# Patient Record
Sex: Female | Born: 1985 | Race: White | Hispanic: No | Marital: Single | Smoking: Current every day smoker
Health system: Southern US, Community
[De-identification: ages and names within clinical notes are randomized; demographics above are authoritative.]

## PROBLEM LIST (undated history)

## (undated) ENCOUNTER — Inpatient Hospital Stay (HOSPITAL_COMMUNITY): Payer: Self-pay

## (undated) DIAGNOSIS — M47816 Spondylosis without myelopathy or radiculopathy, lumbar region: Secondary | ICD-10-CM

## (undated) HISTORY — PX: TENDON REPAIR: SHX5111

## (undated) HISTORY — PX: BREAST SURGERY: SHX581

---
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
## 2015-03-13 ENCOUNTER — Inpatient Hospital Stay (HOSPITAL_COMMUNITY)
Admission: AD | Admit: 2015-03-13 | Discharge: 2015-03-13 | Disposition: A | Discharge status: Home / Self Care | Payer: Medicaid Other | Source: Ambulatory Visit | Attending: Obstetrics and Gynecology | Admitting: Obstetrics and Gynecology

## 2015-03-13 ENCOUNTER — Inpatient Hospital Stay (HOSPITAL_COMMUNITY): Payer: Medicaid Other

## 2015-03-13 ENCOUNTER — Encounter (HOSPITAL_COMMUNITY): Payer: Self-pay | Admitting: *Deleted

## 2015-03-13 DIAGNOSIS — O26899 Other specified pregnancy related conditions, unspecified trimester: Secondary | ICD-10-CM

## 2015-03-13 DIAGNOSIS — O9989 Other specified diseases and conditions complicating pregnancy, childbirth and the puerperium: Secondary | ICD-10-CM | POA: Diagnosis not present

## 2015-03-13 DIAGNOSIS — R109 Unspecified abdominal pain: Secondary | ICD-10-CM | POA: Diagnosis not present

## 2015-03-13 DIAGNOSIS — O99331 Smoking (tobacco) complicating pregnancy, first trimester: Secondary | ICD-10-CM | POA: Diagnosis not present

## 2015-03-13 DIAGNOSIS — Z3A11 11 weeks gestation of pregnancy: Secondary | ICD-10-CM | POA: Diagnosis not present

## 2015-03-13 DIAGNOSIS — F17210 Nicotine dependence, cigarettes, uncomplicated: Secondary | ICD-10-CM | POA: Insufficient documentation

## 2015-03-13 HISTORY — DX: Spondylosis without myelopathy or radiculopathy, lumbar region: M47.816

## 2015-03-13 LAB — CBC
HCT: 35.8 % — ABNORMAL LOW (ref 36.0–46.0)
Hemoglobin: 12.8 g/dL (ref 12.0–15.0)
MCH: 33.7 pg (ref 26.0–34.0)
MCHC: 35.8 g/dL (ref 30.0–36.0)
MCV: 94.2 fL (ref 78.0–100.0)
Platelets: 187 10*3/uL (ref 150–400)
RBC: 3.8 MIL/uL — ABNORMAL LOW (ref 3.87–5.11)
RDW: 13.5 % (ref 11.5–15.5)
WBC: 7.7 10*3/uL (ref 4.0–10.5)

## 2015-03-13 LAB — URINALYSIS, ROUTINE W REFLEX MICROSCOPIC
Bilirubin Urine: NEGATIVE
Glucose, UA: NEGATIVE mg/dL
Hgb urine dipstick: NEGATIVE
KETONES UR: NEGATIVE mg/dL
Leukocytes, UA: NEGATIVE
NITRITE: NEGATIVE
PH: 6 (ref 5.0–8.0)
PROTEIN: NEGATIVE mg/dL
Specific Gravity, Urine: 1.025 (ref 1.005–1.030)
UROBILINOGEN UA: 0.2 mg/dL (ref 0.0–1.0)

## 2015-03-13 LAB — HCG, QUANTITATIVE, PREGNANCY: hCG, Beta Chain, Quant, S: 104940 m[IU]/mL — ABNORMAL HIGH (ref <5)

## 2015-03-13 LAB — ABO/RH: ABO/RH(D): O POS

## 2015-03-13 NOTE — Progress Notes (Signed)
Has first appointment with K. Senaida Oresichardson on 03/19/15

## 2015-03-13 NOTE — MAU Note (Signed)
Pt stated she over streched this morning and ic/o some cramping and pain in her pelvic area.

## 2015-03-13 NOTE — Discharge Instructions (Signed)
Abdominal Pain During Pregnancy °Abdominal pain is common in pregnancy. Most of the time, it does not cause harm. There are many causes of abdominal pain. Some causes are more serious than others. Some of the causes of abdominal pain in pregnancy are easily diagnosed. Occasionally, the diagnosis takes time to understand. Other times, the cause is not determined. Abdominal pain can be a sign that something is very wrong with the pregnancy, or the pain may have nothing to do with the pregnancy at all. For this reason, always tell your health care provider if you have any abdominal discomfort. °HOME CARE INSTRUCTIONS  °Monitor your abdominal pain for any changes. The following actions may help to alleviate any discomfort you are experiencing: °· Do not have sexual intercourse or put anything in your vagina until your symptoms go away completely. °· Get plenty of rest until your pain improves. °· Drink clear fluids if you feel nauseous. Avoid solid food as long as you are uncomfortable or nauseous. °· Only take over-the-counter or prescription medicine as directed by your health care provider. °· Keep all follow-up appointments with your health care provider. °SEEK IMMEDIATE MEDICAL CARE IF: °· You are bleeding, leaking fluid, or passing tissue from the vagina. °· You have increasing pain or cramping. °· You have persistent vomiting. °· You have painful or bloody urination. °· You have a fever. °· You notice a decrease in your baby's movements. °· You have extreme weakness or feel faint. °· You have shortness of breath, with or without abdominal pain. °· You develop a severe headache with abdominal pain. °· You have abnormal vaginal discharge with abdominal pain. °· You have persistent diarrhea. °· You have abdominal pain that continues even after rest, or gets worse. °MAKE SURE YOU:  °· Understand these instructions. °· Will watch your condition. °· Will get help right away if you are not doing well or get  worse. °Document Released: 11/01/2005 Document Revised: 08/22/2013 Document Reviewed: 05/31/2013 °ExitCare® Patient Information ©2015 [employer]. This information is not intended to replace advice given to you by your health care provider. Make sure you discuss any questions you have with your health care provider. °First Trimester of Pregnancy °The first trimester of pregnancy is from week 1 until the end of week 12 (months 1 through 3). A week after a sperm fertilizes an egg, the egg will implant on the wall of the uterus. This embryo will begin to develop into a baby. Genes from you and your partner are forming the baby. The female genes determine whether the baby is a boy or a girl. At 6-8 weeks, the eyes and face are formed, and the heartbeat can be seen on ultrasound. At the end of 12 weeks, all the baby's organs are formed.  °Now that you are pregnant, you will want to do everything you can to have a healthy baby. Two of the most important things are to get good prenatal care and to follow your health care provider's instructions. Prenatal care is all the medical care you receive before the baby's birth. This care will help prevent, find, and treat any problems during the pregnancy and childbirth. °BODY CHANGES °Your body goes through many changes during pregnancy. The changes vary from woman to woman.  °· You may gain or lose a couple of pounds at first. °· You may feel sick to your stomach (nauseous) and throw up (vomit). If the vomiting is uncontrollable, call your health care provider. °· You may tire easily. °·   You may develop headaches that can be relieved by medicines approved by your health care provider. °· You may urinate more often. Painful urination may mean you have a bladder infection. °· You may develop heartburn as a result of your pregnancy. °· You may develop constipation because certain hormones are causing the muscles that push waste through your intestines to slow down. °· You may  develop hemorrhoids or swollen, bulging veins (varicose veins). °· Your breasts may begin to grow larger and become tender. Your nipples may stick out more, and the tissue that surrounds them (areola) may become darker. °· Your gums may bleed and may be sensitive to brushing and flossing. °· Dark spots or blotches (chloasma, mask of pregnancy) may develop on your face. This will likely fade after the baby is born. °· Your menstrual periods will stop. °· You may have a loss of appetite. °· You may develop cravings for certain kinds of food. °· You may have changes in your emotions from day to day, such as being excited to be pregnant or being concerned that something may go wrong with the pregnancy and baby. °· You may have more vivid and strange dreams. °· You may have changes in your hair. These can include thickening of your hair, rapid growth, and changes in texture. Some women also have hair loss during or after pregnancy, or hair that feels dry or thin. Your hair will most likely return to normal after your baby is born. °WHAT TO EXPECT AT YOUR PRENATAL VISITS °During a routine prenatal visit: °· You will be weighed to make sure you and the baby are growing normally. °· Your blood pressure will be taken. °· Your abdomen will be measured to track your baby's growth. °· The fetal heartbeat will be listened to starting around week 10 or 12 of your pregnancy. °· Test results from any previous visits will be discussed. °Your health care provider may ask you: °· How you are feeling. °· If you are feeling the baby move. °· If you have had any abnormal symptoms, such as leaking fluid, bleeding, severe headaches, or abdominal cramping. °· If you have any questions. °Other tests that may be performed during your first trimester include: °· Blood tests to find your blood type and to check for the presence of any previous infections. They will also be used to check for low iron levels (anemia) and Rh antibodies. Later in  the pregnancy, blood tests for diabetes will be done along with other tests if problems develop. °· Urine tests to check for infections, diabetes, or protein in the urine. °· An ultrasound to confirm the proper growth and development of the baby. °· An amniocentesis to check for possible genetic problems. °· Fetal screens for spina bifida and Down syndrome. °· You may need other tests to make sure you and the baby are doing well. °HOME CARE INSTRUCTIONS  °Medicines °· Follow your health care provider's instructions regarding medicine use. Specific medicines may be either safe or unsafe to take during pregnancy. °· Take your prenatal vitamins as directed. °· If you develop constipation, try taking a stool softener if your health care provider approves. °Diet °· Eat regular, well-balanced meals. Choose a variety of foods, such as meat or vegetable-based protein, fish, milk and low-fat dairy products, vegetables, fruits, and whole grain breads and cereals. Your health care provider will help you determine the amount of weight gain that is right for you. °· Avoid raw meat and uncooked cheese. These   carry germs that can cause birth defects in the baby. °· Eating four or five small meals rather than three large meals a day may help relieve nausea and vomiting. If you start to feel nauseous, eating a few soda crackers can be helpful. Drinking liquids between meals instead of during meals also seems to help nausea and vomiting. °· If you develop constipation, eat more high-fiber foods, such as fresh vegetables or fruit and whole grains. Drink enough fluids to keep your urine clear or pale yellow. °Activity and Exercise °· Exercise only as directed by your health care provider. Exercising will help you: °¨ Control your weight. °¨ Stay in shape. °¨ Be prepared for labor and delivery. °· Experiencing pain or cramping in the lower abdomen or low back is a good sign that you should stop exercising. Check with your health care  provider before continuing normal exercises. °· Try to avoid standing for long periods of time. Move your legs often if you must stand in one place for a long time. °· Avoid heavy lifting. °· Wear low-heeled shoes, and practice good posture. °· You may continue to have sex unless your health care provider directs you otherwise. °Relief of Pain or Discomfort °· Wear a good support bra for breast tenderness.   °· Take warm sitz baths to soothe any pain or discomfort caused by hemorrhoids. Use hemorrhoid cream if your health care provider approves.   °· Rest with your legs elevated if you have leg cramps or low back pain. °· If you develop varicose veins in your legs, wear support hose. Elevate your feet for 15 minutes, 3-4 times a day. Limit salt in your diet. °Prenatal Care °· Schedule your prenatal visits by the twelfth week of pregnancy. They are usually scheduled monthly at first, then more often in the last 2 months before delivery. °· Write down your questions. Take them to your prenatal visits. °· Keep all your prenatal visits as directed by your health care provider. °Safety °· Wear your seat belt at all times when driving. °· Make a list of emergency phone numbers, including numbers for family, friends, the hospital, and police and fire departments. °General Tips °· Ask your health care provider for a referral to a local prenatal education class. Begin classes no later than at the beginning of month 6 of your pregnancy. °· Ask for help if you have counseling or nutritional needs during pregnancy. Your health care provider can offer advice or refer you to specialists for help with various needs. °· Do not use hot tubs, steam rooms, or saunas. °· Do not douche or use tampons or scented sanitary pads. °· Do not cross your legs for long periods of time. °· Avoid cat litter boxes and soil used by cats. These carry germs that can cause birth defects in the baby and possibly loss of the fetus by miscarriage or  stillbirth. °· Avoid all smoking, herbs, alcohol, and medicines not prescribed by your health care provider. Chemicals in these affect the formation and growth of the baby. °· Schedule a dentist appointment. At home, brush your teeth with a soft toothbrush and be gentle when you floss. °SEEK MEDICAL CARE IF:  °· You have dizziness. °· You have mild pelvic cramps, pelvic pressure, or nagging pain in the abdominal area. °· You have persistent nausea, vomiting, or diarrhea. °· You have a bad smelling vaginal discharge. °· You have pain with urination. °· You notice increased swelling in your face, hands, legs, or ankles. °SEEK   IMMEDIATE MEDICAL CARE IF:  °· You have a fever. °· You are leaking fluid from your vagina. °· You have spotting or bleeding from your vagina. °· You have severe abdominal cramping or pain. °· You have rapid weight gain or loss. °· You vomit blood or material that looks like coffee grounds. °· You are exposed to German measles and have never had them. °· You are exposed to fifth disease or chickenpox. °· You develop a severe headache. °· You have shortness of breath. °· You have any kind of trauma, such as from a fall or a car accident. °Document Released: 10/26/2001 Document Revised: 03/18/2014 Document Reviewed: 09/11/2013 °ExitCare® Patient Information ©2015 [employer]. This information is not intended to replace advice given to you by your health care provider. Make sure you discuss any questions you have with your health care provider. ° °

## 2015-03-13 NOTE — MAU Provider Note (Signed)
History     CSN: [id number]  Arrival date and time: 03/13/15 1123   None     Chief Complaint  Patient presents with  . Abdominal Pain   Abdominal Pain This is a new problem. The current episode started today. The onset quality is sudden. The problem occurs constantly. The most recent episode lasted 4 hours. The pain is located in the generalized abdominal region. The pain is at a severity of 4/10. The pain is mild. The quality of the pain is cramping. The abdominal pain does not radiate. Pertinent negatives include no dysuria. The pain is aggravated by certain positions. She has tried nothing for the symptoms.   29 y.o. [id number] @ 910w1d presents to the MAU with the complaint of cramping some after stretching this morning. Denies vaginal bleeding, vaginal discharge.   Past Medical History  Diagnosis Date  . Spondylosis of lumbar joint 2006    Past Surgical History  Procedure Laterality Date  . Breast surgery      removal of non cancerous cyst on right breast  . Tendon repair      right index finger    History reviewed. No pertinent family history.  History  Substance Use Topics  . Smoking status: Current Every Day Smoker -- 0.25 packs/day    Types: Cigarettes  . Smokeless tobacco: Not on file  . Alcohol Use: No    Allergies: No Known Allergies  Prescriptions prior to admission  Medication Sig Dispense Refill Last Dose  . Prenatal Vit-Min-FA-Fish Oil ([facility] PRENATAL GUMMY PO) Take 2 each by mouth daily.   03/12/2015 at Unknown time    Review of Systems  Gastrointestinal: Positive for abdominal pain.  Genitourinary: Negative for dysuria.  Musculoskeletal: Positive for back pain.  All other systems reviewed and are negative.  Physical Exam   Blood pressure 110/63, pulse 64, temperature 98.4 F (36.9 C), resp. rate 18, height 5\' 4"  (1.626 m).  Physical Exam  Nursing note and vitals reviewed. Constitutional: She is oriented to person, place, and time. She appears  well-developed and well-nourished. No distress.  HENT:  Head: Normocephalic and atraumatic.  Neck: Normal range of motion.  Cardiovascular: Normal rate.   Respiratory: Effort normal. No respiratory distress.  GI: Soft. She exhibits no distension and no mass. There is no tenderness. There is no rebound and no guarding.  Musculoskeletal: Normal range of motion.  Neurological: She is alert and oriented to person, place, and time.  Skin: Skin is warm and dry.  Psychiatric: She has a normal mood and affect. Her behavior is normal. Judgment and thought content normal.   Results for orders placed or performed during the hospital encounter of 03/13/15 (from the past 24 hour(s))  Urinalysis, Routine w reflex microscopic     Status: None   Collection Time: 03/13/15 11:40 AM  Result Value Ref Range   Color, Urine YELLOW YELLOW   APPearance CLEAR CLEAR   Specific Gravity, Urine 1.025 1.005 - 1.030   pH 6.0 5.0 - 8.0   Glucose, UA NEGATIVE NEGATIVE mg/dL   Hgb urine dipstick NEGATIVE NEGATIVE   Bilirubin Urine NEGATIVE NEGATIVE   Ketones, ur NEGATIVE NEGATIVE mg/dL   Protein, ur NEGATIVE NEGATIVE mg/dL   Urobilinogen, UA 0.2 0.0 - 1.0 mg/dL   Nitrite NEGATIVE NEGATIVE   Leukocytes, UA NEGATIVE NEGATIVE  CBC     Status: Abnormal   Collection Time: 03/13/15 12:20 PM  Result Value Ref Range   WBC 7.7 4.0 - 10.5 K/uL  RBC 3.80 (L) 3.87 - 5.11 MIL/uL   Hemoglobin 12.8 12.0 - 15.0 g/dL   HCT 81.1 (L) 91.4 - 78.2 %   MCV 94.2 78.0 - 100.0 fL   MCH 33.7 26.0 - 34.0 pg   MCHC 35.8 30.0 - 36.0 g/dL   RDW 95.6 21.3 - 08.6 %   Platelets 187 150 - 400 K/uL  ABO/Rh     Status: None (Preliminary result)   Collection Time: 03/13/15 12:20 PM  Result Value Ref Range   ABO/RH(D) O POS     MAU Course  Procedures US Ob Comp Less 14 Wks  03/13/2015   CLINICAL DATA:  Cramping and pain.  EXAM: OBSTETRIC <14 WK ULTRASOUND  TECHNIQUE: Transabdominal ultrasound was performed for evaluation of the gestation  as well as the maternal uterus and adnexal regions.  COMPARISON:  None.  FINDINGS: Intrauterine gestational sac: Single  Yolk sac:  Yes  Embryo:  Yes  Cardiac Activity: Yes  Heart Rate: 162 bpm  CRL:   43.1  mm   11 w 1 d                  [place] EDC: 10/01/2015  Maternal uterus/adnexae:  Subchorionic hemorrhage: No  Right ovary: Not visualize  Left ovary: Appears normal  Other :None  Free fluid:  None  IMPRESSION: 1. There is a single living intrauterine gestation. The estimated gestational age is 11 weeks and 1 day. 2. No complications.   Electronically Signed   By: Signa Kell M.D.   On: 03/13/2015 13:03    MDM Pt has not initiated prenatal care but has her first prenatal visit scheduled with Dr Senaida Ores next week. CBC, U/S; beta hcg; abo   Assessment and Plan  Abdominal Pain in pregnancy  Discharge to home  [facility] 03/13/2015, 1:11 PM

## 2015-03-28 LAB — US OB FOLLOW UP

## 2015-03-31 ENCOUNTER — Other Ambulatory Visit (HOSPITAL_COMMUNITY): Payer: Self-pay | Admitting: Obstetrics and Gynecology

## 2015-03-31 DIAGNOSIS — O283 Abnormal ultrasonic finding on antenatal screening of mother: Secondary | ICD-10-CM

## 2015-04-01 ENCOUNTER — Encounter (HOSPITAL_COMMUNITY): Payer: Self-pay | Admitting: Physician Assistant

## 2015-04-01 ENCOUNTER — Encounter (HOSPITAL_COMMUNITY): Payer: Self-pay | Admitting: Obstetrics and Gynecology

## 2015-04-08 ENCOUNTER — Ambulatory Visit (HOSPITAL_COMMUNITY): Payer: Medicaid Other

## 2015-04-08 ENCOUNTER — Ambulatory Visit (HOSPITAL_COMMUNITY): Admission: RE | Admit: 2015-04-08 | Payer: Medicaid Other | Source: Ambulatory Visit

## 2015-04-11 ENCOUNTER — Ambulatory Visit (HOSPITAL_COMMUNITY): Payer: Medicaid Other

## 2015-04-15 ENCOUNTER — Encounter (HOSPITAL_COMMUNITY): Payer: Self-pay

## 2015-04-15 ENCOUNTER — Other Ambulatory Visit (HOSPITAL_COMMUNITY): Payer: Self-pay | Admitting: Maternal and Fetal Medicine

## 2015-04-15 ENCOUNTER — Ambulatory Visit (HOSPITAL_COMMUNITY)
Admission: RE | Admit: 2015-04-15 | Discharge: 2015-04-15 | Disposition: A | Discharge status: Home / Self Care | Payer: Medicaid Other | Source: Ambulatory Visit | Attending: Obstetrics and Gynecology | Admitting: Obstetrics and Gynecology

## 2015-04-15 DIAGNOSIS — O283 Abnormal ultrasonic finding on antenatal screening of mother: Secondary | ICD-10-CM | POA: Insufficient documentation

## 2015-04-15 DIAGNOSIS — Z3689 Encounter for other specified antenatal screening: Secondary | ICD-10-CM | POA: Insufficient documentation

## 2015-04-15 DIAGNOSIS — O358XX0 Maternal care for other (suspected) fetal abnormality and damage, not applicable or unspecified: Secondary | ICD-10-CM | POA: Insufficient documentation

## 2015-04-15 DIAGNOSIS — Z3A15 15 weeks gestation of pregnancy: Secondary | ICD-10-CM | POA: Insufficient documentation

## 2015-04-15 DIAGNOSIS — O35DXX0 Maternal care for other (suspected) fetal abnormality and damage, fetal gastrointestinal anomalies, not applicable or unspecified: Secondary | ICD-10-CM | POA: Insufficient documentation

## 2015-04-15 DIAGNOSIS — Z315 Encounter for genetic counseling: Secondary | ICD-10-CM | POA: Insufficient documentation

## 2015-04-15 NOTE — Progress Notes (Signed)
Genetic Counseling  High-Risk Gestation Note  Appointment Date:  04/15/2015 Referred By: Newton Pigg, MD Date of Birth:  [birth year]   Pregnancy History: [id number] Estimated Date of Delivery: 10/01/15 Estimated Gestational Age: 45w6dAttending: MRenella Cunas MD   I met with Gabriela Ross. Gabriela Ross TURVEYfor genetic counseling because of abnormal ultrasound findings.  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 Ultrasound performed today confirmed the finding of gastroschisis, which was previously visualized at her OB office. Complete ultrasound results reported separately.   We discussed that gastroschisis occurs in approximately 1 in 560,000-10,000live births, more often in babies born to younger mothers. We reviewed that gastroschisis is considered an abdominal wall defect which is characterized by an opening to the right of the umbilicus through which the intestines and other visceral organs protrude.  This opening usually arises from incomplete closure of the lateral folds during the sixth week of gestation.  We discussed that as a consequence of the intestinal herniation, the unprotected bowel may be damaged and not function well after delivery.  We discussed that gastroschisis is typically not associated with chromosomal anomalies or other structural malformations with the exception of intestinal atresia.  The majority of cases of gastroschisis are thought to be sporadic and multifactorial in etiology, with a low risk of recurrence.  However, there have been reports of both autosomal recessive and dominant inheritance.    We discussed that Ross to place the intestines into the baby's abdominal cavity is typically performed shortly after birth, to minimize the risk of infection.  In addition, we discussed  that prognosis is typically good, but depends on the size of the abdominal opening and whether or not damage has occurred to the exposed organs.    We also spent some time discussing the plans for the rest of the pregnancy.  Since gastroschisis exposes the fetal intestines to the amniotic fluid during pregnancy, there is an increased risk for third trimester complications, such as bowel dilatation, decreased fetal growth and amniotic fluid volume, preterm delivery, and stillbirth.  For these reasons, we discussed the option of close surveillance during the third trimester. Gabriela Ross. RDruryexpressed interest in possible transfer [facility] care [facility]  She understands [facility] delivery[facility]  We discussed the option of meeting with a pediatric surgeon, prior to delivery, to discuss postnatal management and surgical correction.  Gabriela Ross Ross expressed interest in meeting with a sPsychologist, sport and exercise  We will schedule this appointment for them.  We also discussed the option of [facility] with the [facility] and care provided there.  She was interested in this as well.  We will arrange those later in the pregnancy and coordinate those for this patient.  Gabriela Ross. Gabriela Ross Ross had noninvasive prenatal screening (NIPS)/cell free DNA testing performed through her OB office, [facility]. We reviewed that these are within normal limits, showing a less than 1 in 10,000 risk for trisomies 21, 18 and 13, and monosomy X (Turner syndrome).  In addition, the risk for triploidy/vanishing twin and sex chromosome trisomies (47,XXX and 47,XXY) was also low risk.  This testing identifies > 99% of pregnancies with trisomy 272 trisomy 170 sex chromosome trisomies (47,XXX and 47,XXY), and triploidy.  The detection rate for trisomy 18 is 96%.  The detection rate for monosomy X is ~92%.  The  false positive rate is <0.1% for all conditions. Testing was also consistent with female  fetal sex.  She understands that this testing does not identify all genetic conditions.   Both family histories were reviewed and found to be contributory for a possible birth defect for the father of the pregnancy's niece (his brother's daughter). The patient had limited information regarding this relative given limited contact with the brother of the father of the pregnancy. She was not sure of the specific type of birth defect but thought that surgical correction was possibly needed. She also reported that this relative does not likely have a genetic condition or syndrome according to the information other individuals provided to the patient about her. We discussed that birth defects can be sporadic, multifactorial, environmental, or genetic.  Without further information regarding the provided family history, an accurate genetic risk cannot be calculated. Further genetic counseling is warranted if more information is obtained.  Gabriela Ross Ross was provided with written information regarding cystic fibrosis (CF) including the carrier frequency and incidence in the Caucasian population, the availability of carrier testing and prenatal diagnosis if indicated.  In addition, we discussed that CF is routinely screened for as part of the [place] newborn screening panel.  She previously had CF carrier screening which was within normal limits.    Gabriela Ross. Gabriela Ross Ross denied exposure to environmental toxins or chemical agents. She denied the use of alcohol or street drugs. She reported smoking approximately 4-5 cigarettes per day. The associations of smoking in pregnancy were reviewed and cessation encouraged. She denied significant viral illnesses during the course of her pregnancy. Her medical and surgical histories were noncontributory.   I counseled Gabriela Ross. Gabriela Ross Ross regarding the above risks and available options.  The  approximate face-to-face time with the genetic counselor was 35 minutes.  Chipper Oman, Gabriela Ross Certified Genetic Counselor 04/15/2015

## 2015-04-16 ENCOUNTER — Other Ambulatory Visit (HOSPITAL_COMMUNITY): Payer: Self-pay | Admitting: Obstetrics and Gynecology

## 2015-04-16 ENCOUNTER — Encounter (HOSPITAL_COMMUNITY): Payer: Self-pay | Admitting: Obstetrics and Gynecology

## 2015-05-13 ENCOUNTER — Telehealth (HOSPITAL_COMMUNITY): Payer: Self-pay | Admitting: MS"

## 2015-05-13 ENCOUNTER — Ambulatory Visit (HOSPITAL_COMMUNITY)
Admission: RE | Admit: 2015-05-13 | Discharge: 2015-05-13 | Disposition: A | Discharge status: Home / Self Care | Payer: Medicaid Other | Source: Ambulatory Visit | Attending: Obstetrics and Gynecology | Admitting: Obstetrics and Gynecology

## 2015-05-13 DIAGNOSIS — O283 Abnormal ultrasonic finding on antenatal screening of mother: Secondary | ICD-10-CM

## 2015-05-13 NOTE — Telephone Encounter (Signed)
Attempted to call patient regarding missed appointment today. Patient's voicemail box is full, unable to leave message.   Gabriela Ross 05/13/2015 4:07 PM

## 2015-05-23 ENCOUNTER — Ambulatory Visit (HOSPITAL_COMMUNITY)
Admission: RE | Admit: 2015-05-23 | Discharge: 2015-05-23 | Disposition: A | Discharge status: Home / Self Care | Payer: Medicaid Other | Source: Ambulatory Visit | Attending: Obstetrics and Gynecology | Admitting: Obstetrics and Gynecology

## 2015-05-23 ENCOUNTER — Other Ambulatory Visit (HOSPITAL_COMMUNITY): Payer: Self-pay | Admitting: Obstetrics and Gynecology

## 2015-05-23 DIAGNOSIS — M7989 Other specified soft tissue disorders: Secondary | ICD-10-CM | POA: Insufficient documentation

## 2015-05-23 DIAGNOSIS — I82402 Acute embolism and thrombosis of unspecified deep veins of left lower extremity: Secondary | ICD-10-CM

## 2015-05-23 NOTE — Progress Notes (Signed)
VASCULAR LAB PRELIMINARY  PRELIMINARY  PRELIMINARY  PRELIMINARY  Left lower extremity venous duplex completed.    Preliminary report:  Left:  No evidence of DVT, superficial thrombosis, or Baker's cyst.  Reatha Sur, RVT 05/23/2015, 3:32 PM

## 2015-06-19 ENCOUNTER — Telehealth (HOSPITAL_COMMUNITY): Payer: Self-pay | Admitting: MS"

## 2015-06-19 NOTE — Telephone Encounter (Signed)
Called patient to follow-up about prenatal consultation with peds surgeon. Patient stated that she moved back to [place] and is planning to deliver there.   Gabriela Ross 06/19/2015 10:30 AM

## 2016-01-16 ENCOUNTER — Encounter (HOSPITAL_COMMUNITY): Payer: Self-pay | Admitting: *Deleted

## 2016-04-26 IMAGING — US US OB COMP LESS 14 WK
2 series · 14 of 28 positions shown · non-contrast
Comparison: None.

CLINICAL DATA: Cramping and pain.

EXAM:
OBSTETRIC <14 WK ULTRASOUND
TECHNIQUE: Transabdominal ultrasound was performed for evaluation of the
gestation as well as the maternal uterus and adnexal regions.

[Series 1: us ob comp less 14 wk · 0.17mm/px · 35 acquisitions, 13 frames shown (1 of 2)]
[im 2/35]
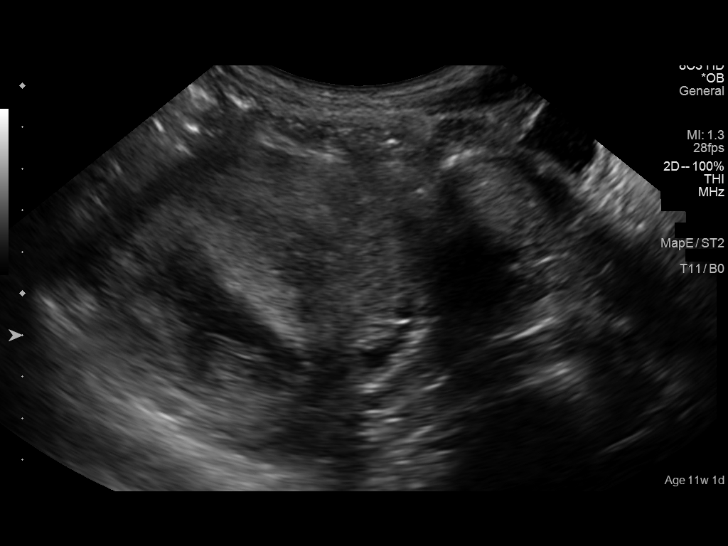
[im 4/35]
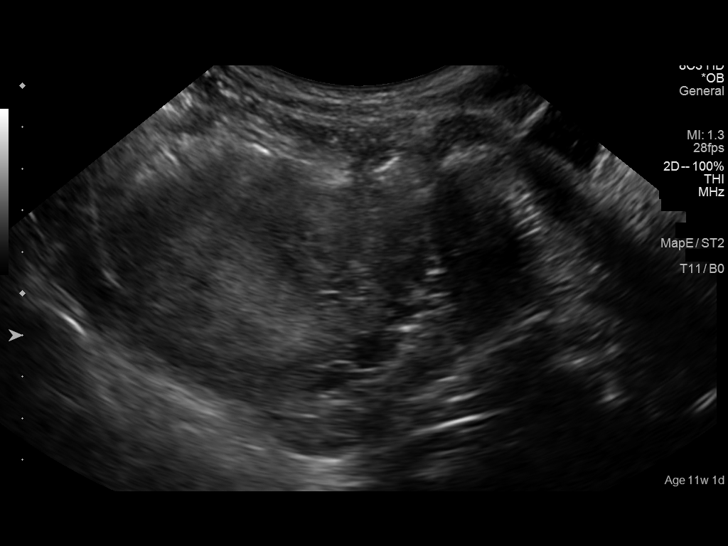
[im 7/35]
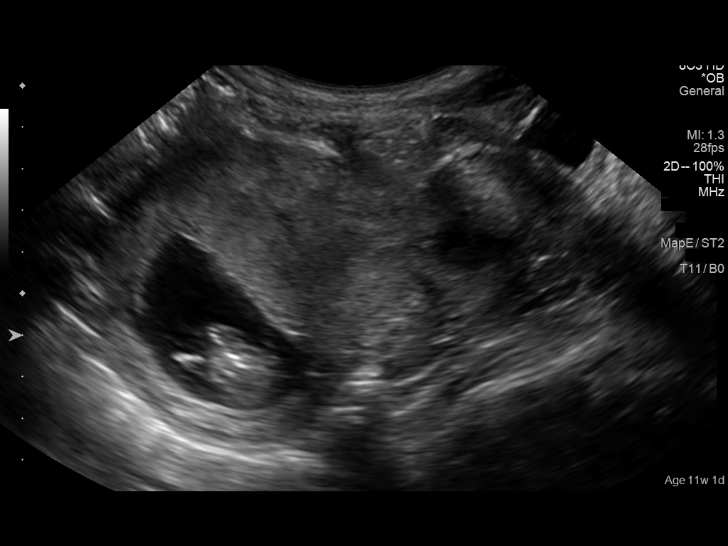
[im 10/35]
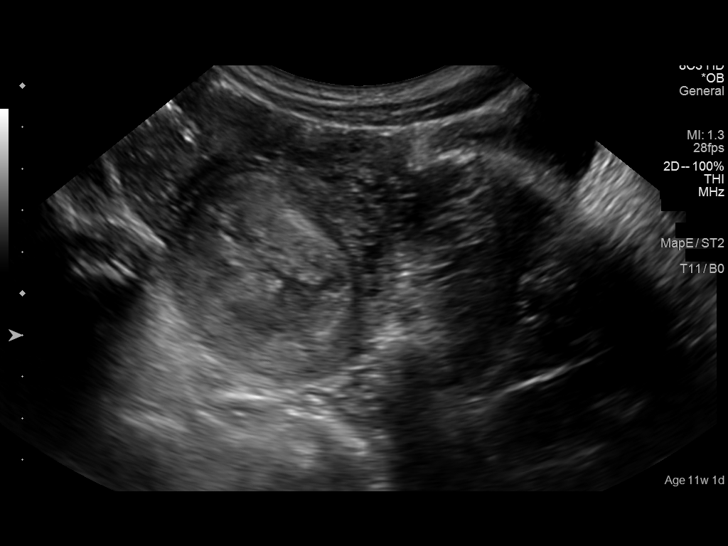
[im 12/35]
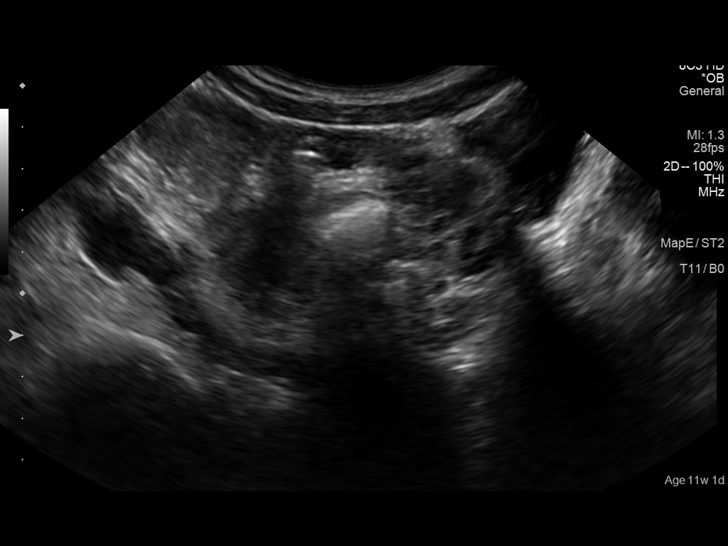
[im 15/35]
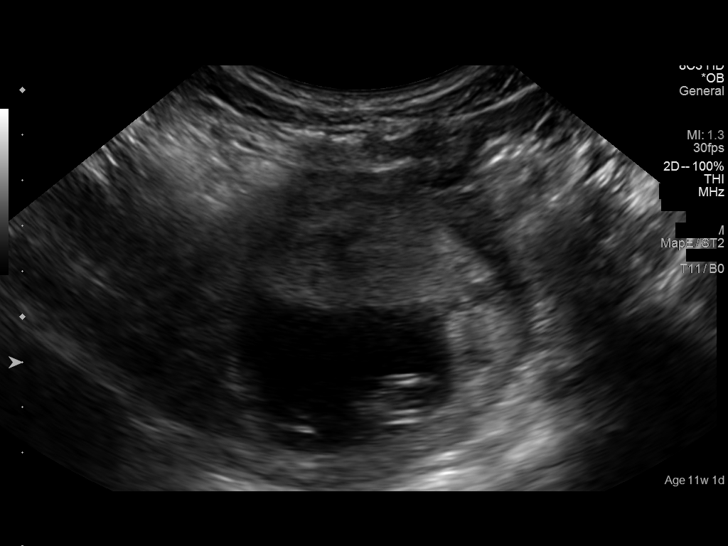
[im 18/35]
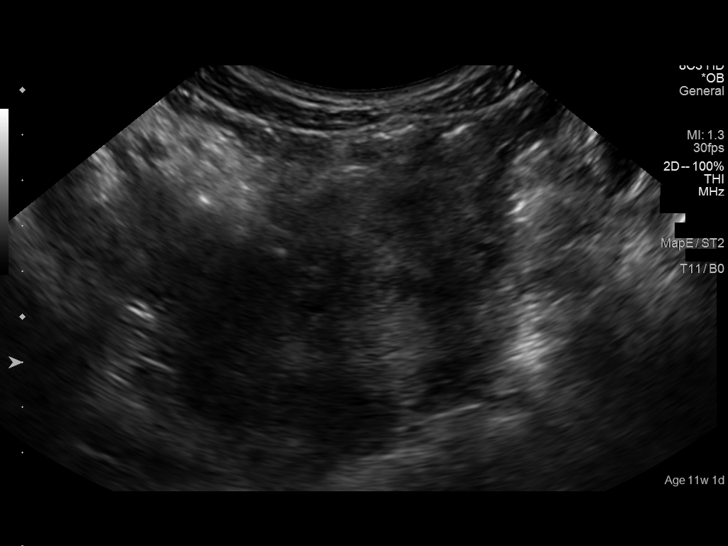
[im 20/35]
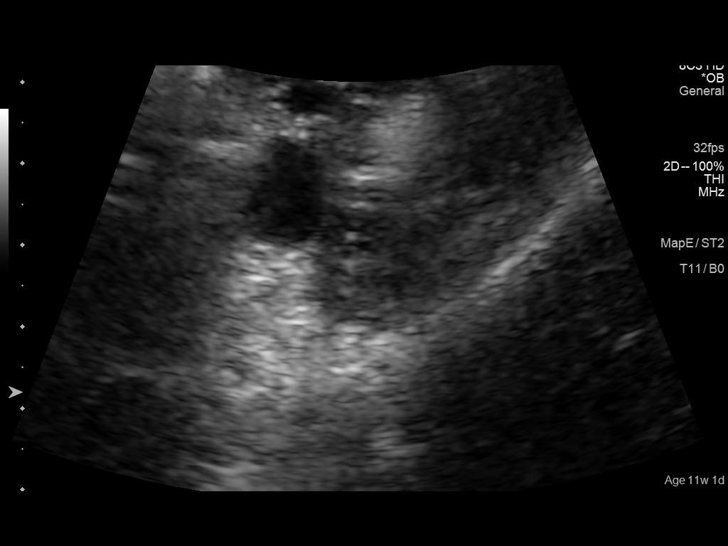
[im 23/35]
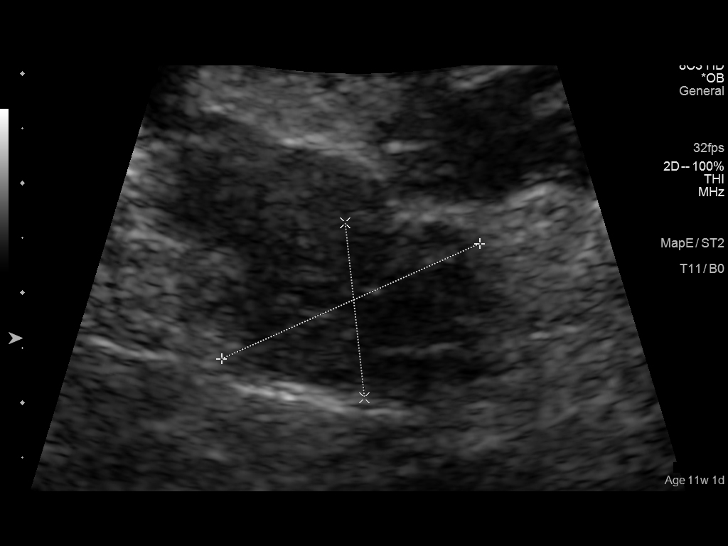
[im 25/35]
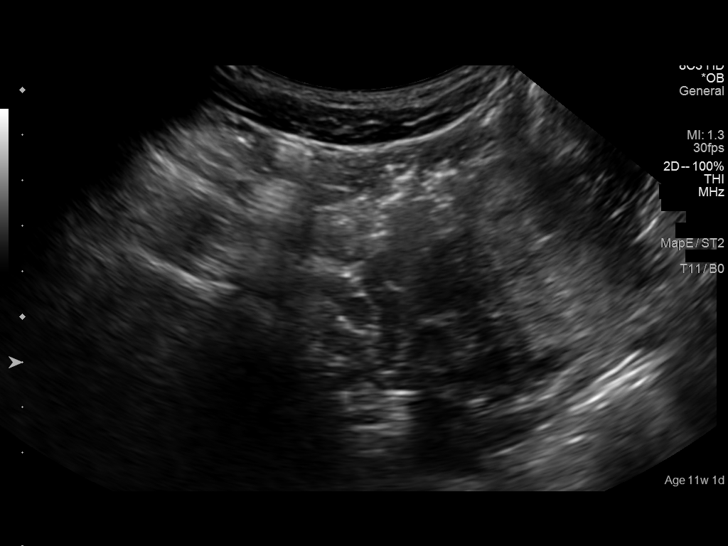
[im 28/35]
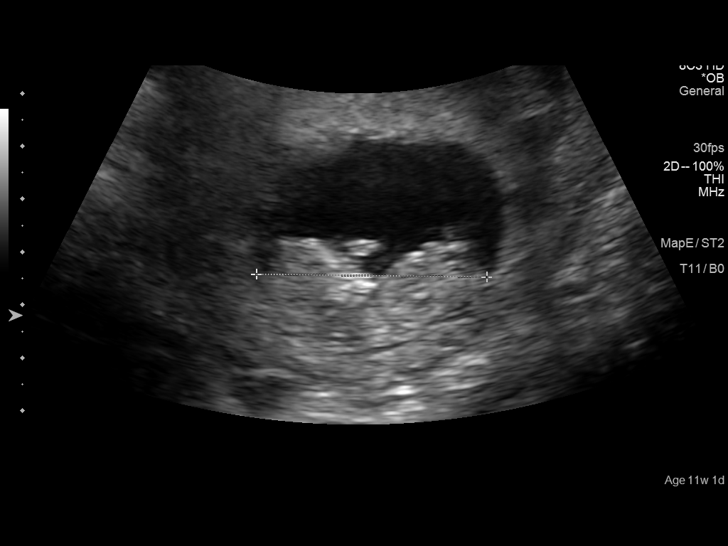
[im 31/35]
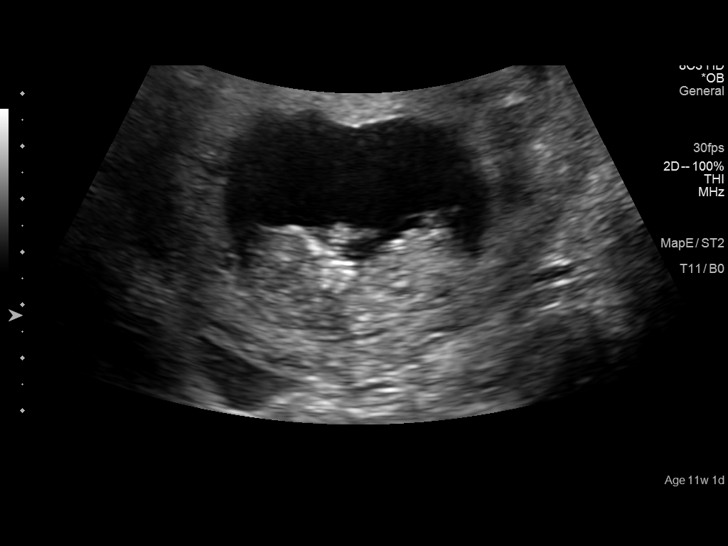
[im 33/35]
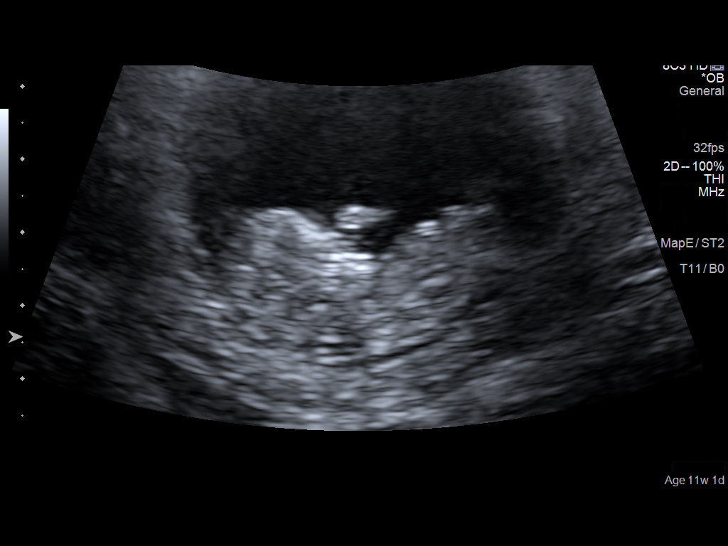

[Series 3: us ob comp less 14 wk · 0.10mm/px · 1 of 1 slices shown (2 of 2)]
[im 1/1]
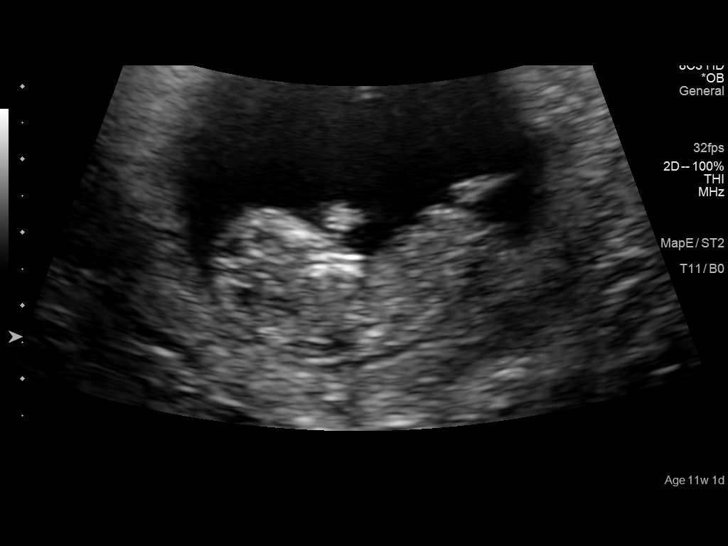

[14 of 28 positions shown; findings below may reference images not displayed]

FINDINGS: Intrauterine gestational sac: Single

Yolk sac:  Yes

Embryo:  Yes

Cardiac Activity: Yes

Heart Rate: 162 bpm

CRL:   43.1  mm   11 w 1 d                  US EDC: 10/01/2015

Maternal uterus/adnexae:

Subchorionic hemorrhage: No

Right ovary: Not visualize

Left ovary: Appears normal

Other :None

Free fluid:  None
IMPRESSION: 1. There is a single living intrauterine gestation. The estimated
gestational age is 11 weeks and 1 day.
2. No complications.
